# Patient Record
Sex: Male | Born: 1995 | Race: White | Hispanic: No | Marital: Single | Smoking: Never smoker
Health system: Southern US, Community
[De-identification: ages and names within clinical notes are randomized; demographics above are authoritative.]

## PROBLEM LIST (undated history)

## (undated) DIAGNOSIS — G43909 Migraine, unspecified, not intractable, without status migrainosus: Secondary | ICD-10-CM

## (undated) HISTORY — DX: Migraine, unspecified, not intractable, without status migrainosus: G43.909

## (undated) HISTORY — PX: ANTERIOR CRUCIATE LIGAMENT REPAIR: SHX115

---
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
## 2016-02-05 DIAGNOSIS — M542 Cervicalgia: Secondary | ICD-10-CM | POA: Diagnosis not present

## 2016-03-17 DIAGNOSIS — R51 Headache: Secondary | ICD-10-CM | POA: Diagnosis not present

## 2016-03-17 DIAGNOSIS — F329 Major depressive disorder, single episode, unspecified: Secondary | ICD-10-CM | POA: Diagnosis not present

## 2016-03-17 DIAGNOSIS — R42 Dizziness and giddiness: Secondary | ICD-10-CM | POA: Diagnosis not present

## 2016-03-17 DIAGNOSIS — F419 Anxiety disorder, unspecified: Secondary | ICD-10-CM | POA: Diagnosis not present

## 2016-03-17 DIAGNOSIS — R55 Syncope and collapse: Secondary | ICD-10-CM | POA: Diagnosis not present

## 2016-03-17 DIAGNOSIS — Z79899 Other long term (current) drug therapy: Secondary | ICD-10-CM | POA: Diagnosis not present

## 2016-03-17 DIAGNOSIS — F429 Obsessive-compulsive disorder, unspecified: Secondary | ICD-10-CM | POA: Diagnosis not present

## 2016-03-17 DIAGNOSIS — I4519 Other right bundle-branch block: Secondary | ICD-10-CM | POA: Diagnosis not present

## 2016-08-14 DIAGNOSIS — G4710 Hypersomnia, unspecified: Secondary | ICD-10-CM | POA: Diagnosis not present

## 2016-10-02 DIAGNOSIS — L237 Allergic contact dermatitis due to plants, except food: Secondary | ICD-10-CM | POA: Diagnosis not present

## 2016-11-20 DIAGNOSIS — G4710 Hypersomnia, unspecified: Secondary | ICD-10-CM | POA: Diagnosis not present

## 2016-12-02 DIAGNOSIS — G4710 Hypersomnia, unspecified: Secondary | ICD-10-CM | POA: Diagnosis not present

## 2017-04-30 DIAGNOSIS — M542 Cervicalgia: Secondary | ICD-10-CM | POA: Diagnosis not present

## 2017-04-30 DIAGNOSIS — Z6820 Body mass index (BMI) 20.0-20.9, adult: Secondary | ICD-10-CM | POA: Diagnosis not present

## 2017-04-30 DIAGNOSIS — J019 Acute sinusitis, unspecified: Secondary | ICD-10-CM | POA: Diagnosis not present

## 2017-10-14 DIAGNOSIS — F411 Generalized anxiety disorder: Secondary | ICD-10-CM | POA: Diagnosis not present

## 2017-10-28 DIAGNOSIS — Z0000 Encounter for general adult medical examination without abnormal findings: Secondary | ICD-10-CM | POA: Diagnosis not present

## 2017-11-04 DIAGNOSIS — Z681 Body mass index (BMI) 19 or less, adult: Secondary | ICD-10-CM | POA: Diagnosis not present

## 2017-11-04 DIAGNOSIS — I498 Other specified cardiac arrhythmias: Secondary | ICD-10-CM | POA: Diagnosis not present

## 2017-11-04 DIAGNOSIS — R002 Palpitations: Secondary | ICD-10-CM | POA: Diagnosis not present

## 2017-11-04 DIAGNOSIS — J309 Allergic rhinitis, unspecified: Secondary | ICD-10-CM | POA: Diagnosis not present

## 2017-11-04 DIAGNOSIS — Z0001 Encounter for general adult medical examination with abnormal findings: Secondary | ICD-10-CM | POA: Diagnosis not present

## 2017-11-26 DIAGNOSIS — F411 Generalized anxiety disorder: Secondary | ICD-10-CM | POA: Diagnosis not present

## 2017-12-25 DIAGNOSIS — F411 Generalized anxiety disorder: Secondary | ICD-10-CM | POA: Diagnosis not present

## 2018-01-27 DIAGNOSIS — R079 Chest pain, unspecified: Secondary | ICD-10-CM | POA: Diagnosis not present

## 2018-01-27 DIAGNOSIS — M542 Cervicalgia: Secondary | ICD-10-CM | POA: Diagnosis not present

## 2018-01-27 DIAGNOSIS — R51 Headache: Secondary | ICD-10-CM | POA: Diagnosis not present

## 2018-01-27 DIAGNOSIS — R002 Palpitations: Secondary | ICD-10-CM | POA: Diagnosis not present

## 2018-02-24 DIAGNOSIS — K219 Gastro-esophageal reflux disease without esophagitis: Secondary | ICD-10-CM | POA: Diagnosis not present

## 2018-02-24 DIAGNOSIS — F419 Anxiety disorder, unspecified: Secondary | ICD-10-CM | POA: Diagnosis not present

## 2018-02-24 DIAGNOSIS — J060 Acute laryngopharyngitis: Secondary | ICD-10-CM | POA: Diagnosis not present

## 2018-05-05 DIAGNOSIS — F3340 Major depressive disorder, recurrent, in remission, unspecified: Secondary | ICD-10-CM | POA: Diagnosis not present

## 2018-05-05 DIAGNOSIS — F411 Generalized anxiety disorder: Secondary | ICD-10-CM | POA: Diagnosis not present

## 2018-05-05 DIAGNOSIS — F0634 Mood disorder due to known physiological condition with mixed features: Secondary | ICD-10-CM | POA: Diagnosis not present

## 2018-06-07 DIAGNOSIS — F411 Generalized anxiety disorder: Secondary | ICD-10-CM | POA: Diagnosis not present

## 2018-06-07 DIAGNOSIS — F0634 Mood disorder due to known physiological condition with mixed features: Secondary | ICD-10-CM | POA: Diagnosis not present

## 2018-07-19 DIAGNOSIS — F411 Generalized anxiety disorder: Secondary | ICD-10-CM | POA: Diagnosis not present

## 2018-07-19 DIAGNOSIS — F0634 Mood disorder due to known physiological condition with mixed features: Secondary | ICD-10-CM | POA: Diagnosis not present

## 2018-10-28 DIAGNOSIS — H6123 Impacted cerumen, bilateral: Secondary | ICD-10-CM | POA: Diagnosis not present

## 2018-11-02 DIAGNOSIS — Z23 Encounter for immunization: Secondary | ICD-10-CM | POA: Diagnosis not present

## 2018-12-13 DIAGNOSIS — F411 Generalized anxiety disorder: Secondary | ICD-10-CM | POA: Diagnosis not present

## 2018-12-13 DIAGNOSIS — F0634 Mood disorder due to known physiological condition with mixed features: Secondary | ICD-10-CM | POA: Diagnosis not present

## 2019-01-17 DIAGNOSIS — F0633 Mood disorder due to known physiological condition with manic features: Secondary | ICD-10-CM | POA: Diagnosis not present

## 2019-01-17 DIAGNOSIS — F411 Generalized anxiety disorder: Secondary | ICD-10-CM | POA: Diagnosis not present

## 2019-04-14 DIAGNOSIS — F411 Generalized anxiety disorder: Secondary | ICD-10-CM | POA: Diagnosis not present

## 2019-05-26 DIAGNOSIS — F411 Generalized anxiety disorder: Secondary | ICD-10-CM | POA: Diagnosis not present

## 2019-05-26 DIAGNOSIS — F0633 Mood disorder due to known physiological condition with manic features: Secondary | ICD-10-CM | POA: Diagnosis not present

## 2020-01-11 DIAGNOSIS — K219 Gastro-esophageal reflux disease without esophagitis: Secondary | ICD-10-CM | POA: Diagnosis not present

## 2020-02-02 DIAGNOSIS — F411 Generalized anxiety disorder: Secondary | ICD-10-CM | POA: Diagnosis not present

## 2020-02-02 DIAGNOSIS — F0633 Mood disorder due to known physiological condition with manic features: Secondary | ICD-10-CM | POA: Diagnosis not present

## 2020-03-02 DIAGNOSIS — F411 Generalized anxiety disorder: Secondary | ICD-10-CM | POA: Diagnosis not present

## 2020-03-02 DIAGNOSIS — F0633 Mood disorder due to known physiological condition with manic features: Secondary | ICD-10-CM | POA: Diagnosis not present

## 2020-03-21 DIAGNOSIS — S8992XA Unspecified injury of left lower leg, initial encounter: Secondary | ICD-10-CM | POA: Diagnosis not present

## 2020-03-21 DIAGNOSIS — S83512A Sprain of anterior cruciate ligament of left knee, initial encounter: Secondary | ICD-10-CM | POA: Diagnosis not present

## 2020-03-21 DIAGNOSIS — S83519A Sprain of anterior cruciate ligament of unspecified knee, initial encounter: Secondary | ICD-10-CM | POA: Diagnosis not present

## 2020-03-21 DIAGNOSIS — Y9323 Activity, snow (alpine) (downhill) skiing, snow boarding, sledding, tobogganing and snow tubing: Secondary | ICD-10-CM | POA: Diagnosis not present

## 2020-03-29 DIAGNOSIS — J0100 Acute maxillary sinusitis, unspecified: Secondary | ICD-10-CM | POA: Diagnosis not present

## 2020-03-29 DIAGNOSIS — M25562 Pain in left knee: Secondary | ICD-10-CM | POA: Diagnosis not present

## 2020-04-10 DIAGNOSIS — S83512A Sprain of anterior cruciate ligament of left knee, initial encounter: Secondary | ICD-10-CM | POA: Diagnosis not present

## 2020-04-11 DIAGNOSIS — M25562 Pain in left knee: Secondary | ICD-10-CM | POA: Diagnosis not present

## 2020-04-13 DIAGNOSIS — S83512A Sprain of anterior cruciate ligament of left knee, initial encounter: Secondary | ICD-10-CM | POA: Diagnosis not present

## 2020-05-07 DIAGNOSIS — S83512A Sprain of anterior cruciate ligament of left knee, initial encounter: Secondary | ICD-10-CM | POA: Diagnosis not present

## 2020-05-07 DIAGNOSIS — S83282A Other tear of lateral meniscus, current injury, left knee, initial encounter: Secondary | ICD-10-CM | POA: Diagnosis not present

## 2020-05-07 DIAGNOSIS — X58XXXA Exposure to other specified factors, initial encounter: Secondary | ICD-10-CM | POA: Diagnosis not present

## 2020-05-07 DIAGNOSIS — Y999 Unspecified external cause status: Secondary | ICD-10-CM | POA: Diagnosis not present

## 2020-05-07 DIAGNOSIS — G8918 Other acute postprocedural pain: Secondary | ICD-10-CM | POA: Diagnosis not present

## 2020-05-15 DIAGNOSIS — S83512D Sprain of anterior cruciate ligament of left knee, subsequent encounter: Secondary | ICD-10-CM | POA: Diagnosis not present

## 2020-05-31 DIAGNOSIS — S83512D Sprain of anterior cruciate ligament of left knee, subsequent encounter: Secondary | ICD-10-CM | POA: Diagnosis not present

## 2020-06-11 DIAGNOSIS — M25562 Pain in left knee: Secondary | ICD-10-CM | POA: Diagnosis not present

## 2020-06-14 DIAGNOSIS — M25562 Pain in left knee: Secondary | ICD-10-CM | POA: Diagnosis not present

## 2020-06-15 DIAGNOSIS — Z0001 Encounter for general adult medical examination with abnormal findings: Secondary | ICD-10-CM | POA: Diagnosis not present

## 2020-06-19 DIAGNOSIS — M25562 Pain in left knee: Secondary | ICD-10-CM | POA: Diagnosis not present

## 2020-06-21 DIAGNOSIS — M25562 Pain in left knee: Secondary | ICD-10-CM | POA: Diagnosis not present

## 2020-07-03 DIAGNOSIS — E559 Vitamin D deficiency, unspecified: Secondary | ICD-10-CM | POA: Diagnosis not present

## 2020-07-03 DIAGNOSIS — R509 Fever, unspecified: Secondary | ICD-10-CM | POA: Diagnosis not present

## 2020-07-03 DIAGNOSIS — K219 Gastro-esophageal reflux disease without esophagitis: Secondary | ICD-10-CM | POA: Diagnosis not present

## 2020-07-03 DIAGNOSIS — R300 Dysuria: Secondary | ICD-10-CM | POA: Diagnosis not present

## 2020-07-03 DIAGNOSIS — F419 Anxiety disorder, unspecified: Secondary | ICD-10-CM | POA: Diagnosis not present

## 2020-07-03 DIAGNOSIS — J060 Acute laryngopharyngitis: Secondary | ICD-10-CM | POA: Diagnosis not present

## 2020-07-03 DIAGNOSIS — J029 Acute pharyngitis, unspecified: Secondary | ICD-10-CM | POA: Diagnosis not present

## 2020-07-03 DIAGNOSIS — R0981 Nasal congestion: Secondary | ICD-10-CM | POA: Diagnosis not present

## 2020-07-05 DIAGNOSIS — R5382 Chronic fatigue, unspecified: Secondary | ICD-10-CM | POA: Diagnosis not present

## 2020-07-05 DIAGNOSIS — F419 Anxiety disorder, unspecified: Secondary | ICD-10-CM | POA: Diagnosis not present

## 2020-07-05 DIAGNOSIS — F900 Attention-deficit hyperactivity disorder, predominantly inattentive type: Secondary | ICD-10-CM | POA: Diagnosis not present

## 2020-07-05 DIAGNOSIS — G4700 Insomnia, unspecified: Secondary | ICD-10-CM | POA: Diagnosis not present

## 2020-07-19 DIAGNOSIS — F900 Attention-deficit hyperactivity disorder, predominantly inattentive type: Secondary | ICD-10-CM | POA: Diagnosis not present

## 2020-09-04 DIAGNOSIS — S83512D Sprain of anterior cruciate ligament of left knee, subsequent encounter: Secondary | ICD-10-CM | POA: Diagnosis not present

## 2020-12-04 DIAGNOSIS — S83512D Sprain of anterior cruciate ligament of left knee, subsequent encounter: Secondary | ICD-10-CM | POA: Diagnosis not present

## 2020-12-18 DIAGNOSIS — S338XXA Sprain of other parts of lumbar spine and pelvis, initial encounter: Secondary | ICD-10-CM | POA: Diagnosis not present

## 2020-12-18 DIAGNOSIS — S134XXA Sprain of ligaments of cervical spine, initial encounter: Secondary | ICD-10-CM | POA: Diagnosis not present

## 2020-12-18 DIAGNOSIS — M546 Pain in thoracic spine: Secondary | ICD-10-CM | POA: Diagnosis not present

## 2020-12-20 DIAGNOSIS — F5104 Psychophysiologic insomnia: Secondary | ICD-10-CM | POA: Diagnosis not present

## 2020-12-20 DIAGNOSIS — F909 Attention-deficit hyperactivity disorder, unspecified type: Secondary | ICD-10-CM | POA: Diagnosis not present

## 2020-12-20 DIAGNOSIS — F319 Bipolar disorder, unspecified: Secondary | ICD-10-CM | POA: Diagnosis not present

## 2020-12-21 DIAGNOSIS — M6281 Muscle weakness (generalized): Secondary | ICD-10-CM | POA: Diagnosis not present

## 2020-12-21 DIAGNOSIS — M25562 Pain in left knee: Secondary | ICD-10-CM | POA: Diagnosis not present

## 2020-12-24 DIAGNOSIS — M25562 Pain in left knee: Secondary | ICD-10-CM | POA: Diagnosis not present

## 2020-12-24 DIAGNOSIS — M6281 Muscle weakness (generalized): Secondary | ICD-10-CM | POA: Diagnosis not present

## 2020-12-26 DIAGNOSIS — M25562 Pain in left knee: Secondary | ICD-10-CM | POA: Diagnosis not present

## 2020-12-26 DIAGNOSIS — S134XXA Sprain of ligaments of cervical spine, initial encounter: Secondary | ICD-10-CM | POA: Diagnosis not present

## 2020-12-26 DIAGNOSIS — S338XXA Sprain of other parts of lumbar spine and pelvis, initial encounter: Secondary | ICD-10-CM | POA: Diagnosis not present

## 2020-12-26 DIAGNOSIS — M546 Pain in thoracic spine: Secondary | ICD-10-CM | POA: Diagnosis not present

## 2020-12-26 DIAGNOSIS — M6281 Muscle weakness (generalized): Secondary | ICD-10-CM | POA: Diagnosis not present

## 2020-12-31 DIAGNOSIS — M6281 Muscle weakness (generalized): Secondary | ICD-10-CM | POA: Diagnosis not present

## 2020-12-31 DIAGNOSIS — M25562 Pain in left knee: Secondary | ICD-10-CM | POA: Diagnosis not present

## 2021-01-02 DIAGNOSIS — S134XXA Sprain of ligaments of cervical spine, initial encounter: Secondary | ICD-10-CM | POA: Diagnosis not present

## 2021-01-02 DIAGNOSIS — M6281 Muscle weakness (generalized): Secondary | ICD-10-CM | POA: Diagnosis not present

## 2021-01-02 DIAGNOSIS — S338XXA Sprain of other parts of lumbar spine and pelvis, initial encounter: Secondary | ICD-10-CM | POA: Diagnosis not present

## 2021-01-02 DIAGNOSIS — M25562 Pain in left knee: Secondary | ICD-10-CM | POA: Diagnosis not present

## 2021-01-02 DIAGNOSIS — M546 Pain in thoracic spine: Secondary | ICD-10-CM | POA: Diagnosis not present

## 2021-01-09 DIAGNOSIS — S338XXA Sprain of other parts of lumbar spine and pelvis, initial encounter: Secondary | ICD-10-CM | POA: Diagnosis not present

## 2021-01-09 DIAGNOSIS — S134XXA Sprain of ligaments of cervical spine, initial encounter: Secondary | ICD-10-CM | POA: Diagnosis not present

## 2021-01-09 DIAGNOSIS — M25562 Pain in left knee: Secondary | ICD-10-CM | POA: Diagnosis not present

## 2021-01-09 DIAGNOSIS — M546 Pain in thoracic spine: Secondary | ICD-10-CM | POA: Diagnosis not present

## 2021-01-09 DIAGNOSIS — M6281 Muscle weakness (generalized): Secondary | ICD-10-CM | POA: Diagnosis not present

## 2021-01-14 DIAGNOSIS — M6281 Muscle weakness (generalized): Secondary | ICD-10-CM | POA: Diagnosis not present

## 2021-01-14 DIAGNOSIS — M25562 Pain in left knee: Secondary | ICD-10-CM | POA: Diagnosis not present

## 2021-01-15 DIAGNOSIS — M25562 Pain in left knee: Secondary | ICD-10-CM | POA: Diagnosis not present

## 2021-01-16 DIAGNOSIS — M546 Pain in thoracic spine: Secondary | ICD-10-CM | POA: Diagnosis not present

## 2021-01-16 DIAGNOSIS — S338XXA Sprain of other parts of lumbar spine and pelvis, initial encounter: Secondary | ICD-10-CM | POA: Diagnosis not present

## 2021-01-16 DIAGNOSIS — S134XXA Sprain of ligaments of cervical spine, initial encounter: Secondary | ICD-10-CM | POA: Diagnosis not present

## 2021-01-18 DIAGNOSIS — M6281 Muscle weakness (generalized): Secondary | ICD-10-CM | POA: Diagnosis not present

## 2021-01-18 DIAGNOSIS — M25562 Pain in left knee: Secondary | ICD-10-CM | POA: Diagnosis not present

## 2021-01-21 DIAGNOSIS — Z760 Encounter for issue of repeat prescription: Secondary | ICD-10-CM | POA: Diagnosis not present

## 2021-01-21 DIAGNOSIS — F801 Expressive language disorder: Secondary | ICD-10-CM | POA: Diagnosis not present

## 2021-01-21 DIAGNOSIS — F319 Bipolar disorder, unspecified: Secondary | ICD-10-CM | POA: Diagnosis not present

## 2021-01-21 DIAGNOSIS — F909 Attention-deficit hyperactivity disorder, unspecified type: Secondary | ICD-10-CM | POA: Diagnosis not present

## 2021-01-30 DIAGNOSIS — S134XXA Sprain of ligaments of cervical spine, initial encounter: Secondary | ICD-10-CM | POA: Diagnosis not present

## 2021-01-30 DIAGNOSIS — S338XXA Sprain of other parts of lumbar spine and pelvis, initial encounter: Secondary | ICD-10-CM | POA: Diagnosis not present

## 2021-01-30 DIAGNOSIS — M546 Pain in thoracic spine: Secondary | ICD-10-CM | POA: Diagnosis not present

## 2021-02-01 DIAGNOSIS — M25562 Pain in left knee: Secondary | ICD-10-CM | POA: Diagnosis not present

## 2021-02-01 DIAGNOSIS — S83512D Sprain of anterior cruciate ligament of left knee, subsequent encounter: Secondary | ICD-10-CM | POA: Diagnosis not present

## 2021-02-01 DIAGNOSIS — M6281 Muscle weakness (generalized): Secondary | ICD-10-CM | POA: Diagnosis not present

## 2021-02-06 DIAGNOSIS — M546 Pain in thoracic spine: Secondary | ICD-10-CM | POA: Diagnosis not present

## 2021-02-06 DIAGNOSIS — S134XXA Sprain of ligaments of cervical spine, initial encounter: Secondary | ICD-10-CM | POA: Diagnosis not present

## 2021-02-06 DIAGNOSIS — S338XXA Sprain of other parts of lumbar spine and pelvis, initial encounter: Secondary | ICD-10-CM | POA: Diagnosis not present

## 2021-02-13 DIAGNOSIS — M546 Pain in thoracic spine: Secondary | ICD-10-CM | POA: Diagnosis not present

## 2021-02-13 DIAGNOSIS — S134XXA Sprain of ligaments of cervical spine, initial encounter: Secondary | ICD-10-CM | POA: Diagnosis not present

## 2021-02-13 DIAGNOSIS — S338XXA Sprain of other parts of lumbar spine and pelvis, initial encounter: Secondary | ICD-10-CM | POA: Diagnosis not present

## 2021-02-26 DIAGNOSIS — J029 Acute pharyngitis, unspecified: Secondary | ICD-10-CM | POA: Diagnosis not present

## 2021-02-28 DIAGNOSIS — M546 Pain in thoracic spine: Secondary | ICD-10-CM | POA: Diagnosis not present

## 2021-02-28 DIAGNOSIS — S338XXA Sprain of other parts of lumbar spine and pelvis, initial encounter: Secondary | ICD-10-CM | POA: Diagnosis not present

## 2021-02-28 DIAGNOSIS — S134XXA Sprain of ligaments of cervical spine, initial encounter: Secondary | ICD-10-CM | POA: Diagnosis not present

## 2021-03-04 ENCOUNTER — Ambulatory Visit (HOSPITAL_COMMUNITY): Payer: BC Managed Care – PPO | Attending: Family Medicine | Admitting: Speech Pathology

## 2021-03-04 DIAGNOSIS — R4789 Other speech disturbances: Secondary | ICD-10-CM | POA: Insufficient documentation

## 2021-03-11 ENCOUNTER — Other Ambulatory Visit: Payer: Self-pay

## 2021-03-11 ENCOUNTER — Ambulatory Visit (HOSPITAL_COMMUNITY): Payer: BC Managed Care – PPO | Admitting: Speech Pathology

## 2021-03-11 ENCOUNTER — Encounter (HOSPITAL_COMMUNITY): Payer: Self-pay | Admitting: Speech Pathology

## 2021-03-11 DIAGNOSIS — R4789 Other speech disturbances: Secondary | ICD-10-CM | POA: Diagnosis not present

## 2021-03-11 DIAGNOSIS — M546 Pain in thoracic spine: Secondary | ICD-10-CM | POA: Diagnosis not present

## 2021-03-11 DIAGNOSIS — S338XXA Sprain of other parts of lumbar spine and pelvis, initial encounter: Secondary | ICD-10-CM | POA: Diagnosis not present

## 2021-03-11 DIAGNOSIS — S134XXA Sprain of ligaments of cervical spine, initial encounter: Secondary | ICD-10-CM | POA: Diagnosis not present

## 2021-03-11 NOTE — Therapy (Signed)
[facility] [address] [place], [place], [address] Phone: [phone]   Fax:  [phone]  Speech Language Pathology Evaluation  Patient Details  Name: Evan Olsen MRN: [id number] Date of Birth: 04/26/1995 Referring Provider (SLP): Leone Payor, FNP   Encounter Date: 03/11/2021   End of Session - 03/11/21 1641     Visit Number 1    Number of Visits 4    Date for SLP Re-Evaluation 04/11/21    Authorization Type BCBS Comm PPO    SLP Start Time [phone]    SLP Stop Time  1000    SLP Time Calculation (min) 56 min    Activity Tolerance Patient tolerated treatment well             History reviewed. No pertinent past medical history.  History reviewed. No pertinent surgical history.  There were no vitals filed for this visit.   Subjective Assessment - 03/11/21 0933     Subjective "People can't understand me."    Currently in Pain? No/denies                SLP Evaluation [facility] - 03/11/21 1633       SLP Visit Information   SLP Received On 03/11/21    Referring Provider (SLP) Leone Payor, FNP    Onset Date 01/21/2021    Medical Diagnosis expressive language disorder per MD      Subjective   Subjective "People ask me to slow down."    Patient/Family Stated Goal Improve speech      Pain Assessment   Currently in Pain? No/denies      General Information   HPI Evan Olsen is a 25 yo male who was referred by Leone Payor, FNP for a speech/language evaluation and treatment due to Pt reports of reduced speech intelligibility and hesistations in his speech. He reports that he has always been a "fast talker" and that when he slows down, his speech improves. Pt had braces for 9 years.    Behavioral/Cognition alert and cooperative    Mobility Status ambulatory      Balance Screen   Has the patient fallen in the past 6 months No    Has the patient had a decrease in activity level because of a fear of falling?  No     Is the patient reluctant to leave their home because of a fear of falling?  No      Prior Functional Status   Cognitive/Linguistic Baseline Within functional limits    Type of Home House     Lives With Family    Available Support Family    Education college    Vocation Student      Cognition   Overall Cognitive Status Within Functional Limits for tasks assessed    Memory Appears intact    Awareness Appears intact    Problem Solving Appears intact      Auditory Comprehension   Overall Auditory Comprehension Appears within functional limits for tasks assessed      Visual Recognition/Discrimination   Discrimination Within Function Limits      Reading Comprehension   Reading Status Within funtional limits      Expression   Primary Mode of Expression Verbal      Verbal Expression   Overall Verbal Expression Appears within functional limits for tasks assessed    Initiation No impairment    Level of Generative/Spontaneous Verbalization Conversation    Repetition No impairment    Naming  No impairment    Pragmatics No impairment    Interfering Components Speech intelligibility    Non-Verbal Means of Communication Not applicable      Written Expression   Written Expression Not tested      Oral Motor/Sensory Function   Overall Oral Motor/Sensory Function Appears within functional limits for tasks assessed    Velum Within Functional Limits    Mandible Within Functional Limits      Motor Speech   Overall Motor Speech Impaired    Respiration Within functional limits    Phonation Normal    Resonance Within functional limits    Articulation Impaired    Level of Impairment Conversation    Intelligibility Intelligibility reduced    Word 75-100% accurate    Phrase 75-100% accurate    Sentence 75-100% accurate    Conversation 75-100% accurate    Motor Planning Witnin functional limits    Motor Speech Errors Aware;Inconsistent    Effective Techniques Slow  rate;Over-articulate;Pause    Phonation [facility]               SLP Education - 03/11/21 1640     Education Details Plan for short term SLP therapy to address speech deficits    Person(s) Educated Patient    Methods Explanation;Handout    Comprehension Verbalized understanding              03/11/21 1643  SLP SHORT TERM GOAL #1  Title Pt will utilize speech intelligibility strategies (over articulation, reduced speaking rate, and vocal intensity) at the conversation level with 95% acc and cues for error awareness as needed.  Baseline 90%  Time 4  Period Weeks  Status New  Target Date 04/11/21  SLP SHORT TERM GOAL #2  Title Pt will implement fluency enhancing and speech intelligibility strategies during oral reading of short paragraphs with 95% effectiveness and cues for error awareness.  Baseline 90%  Time 4  Period Weeks  Status New  Target Date 04/11/21      Plan - 03/11/21 1642     Clinical Impression Statement Pt presents with mild reduced speech intelligibility and dysfluency of speech characterized by occasional hesitations, rapid speaking rate, and reduced movement of articulators. Cognitive and language skills are WNL. Evan Olsen states that he has taken a few public speaking courses and "did well" in the class. He is frustrated when others cannot understand him, but says when he slows down, his speech is improved. He is studying winter weather systems and would like to possibly go into broadcasting at some point. He reports that his speech difficulties happen in small groups, larger groups, and on the telephone. Pt was recorded in the evaluation and no sound repetitions were heard, only occasional hesitations, and mild reduced speech intelligibility during oral reading task. Pt was cued to decrease rate and over articulate during second oral reading task and resulted in notable improved speech intelligibility. Recommend 2-3 SLP sessions to teach compensatory strategies of  reducing rate, over articulating, and purposeful pauses for fluency and speech intelligibility. Pt in agreement with plan of care.    Speech Therapy Frequency 1x /week    Duration 4 weeks    Treatment/Interventions Compensatory strategies;Patient/family education;Cueing hierarchy;Compensatory techniques;Internal/external aids;SLP instruction and feedback    Potential to Achieve Goals Good    SLP Home Exercise Plan Pt will completed HEP as assigned to facilitate carryover of treatment strategies and techniques in home environment with use of written cues as needed.    Consulted and Agree with  Plan of Care Patient             Patient will benefit from skilled therapeutic intervention in order to improve the following deficits and impairments:   Dysfluency    Problem List There are no problems to display for this patient.  Thank you,  Evan Olsen, CCC-SLP [phone]  Evan Olsen 03/11/2021, 4:45 PM  [facility] [address] [place], [place], [address] Phone: [phone]   Fax:  [phone]  Name: RALPHEAL ZAPPONE MRN: [id number] Date of Birth: 05-24-1995

## 2021-03-19 DIAGNOSIS — Z111 Encounter for screening for respiratory tuberculosis: Secondary | ICD-10-CM | POA: Diagnosis not present

## 2021-03-20 ENCOUNTER — Ambulatory Visit (HOSPITAL_COMMUNITY): Payer: BC Managed Care – PPO | Admitting: Speech Pathology

## 2021-03-20 ENCOUNTER — Encounter (HOSPITAL_COMMUNITY): Payer: Self-pay | Admitting: Speech Pathology

## 2021-03-20 ENCOUNTER — Other Ambulatory Visit: Payer: Self-pay

## 2021-03-20 DIAGNOSIS — R4789 Other speech disturbances: Secondary | ICD-10-CM

## 2021-03-20 NOTE — Therapy (Signed)
[place], [place], [address] Phone: [phone]   Fax:  [phone]  Speech Language Pathology Treatment  Patient Details  Name: Evan Olsen MRN: [id number] Date of Birth: 1995/09/20 Referring Provider (SLP): Valentino Nose, FNP   Encounter Date: 03/20/2021   End of Session - 03/20/21 1435     Visit Number 2    Number of Visits 4    Date for SLP Re-Evaluation 04/11/21    Authorization Type BCBS Comm PPO   BCBS COMM PPO 1/1-12/31/2022  Deduct 750- met  Covered at 100%  OOP 3500-met  visit limit 90- 0 used NO AUTH REQURIED   SLP Start Time 0915    SLP Stop Time  1000    SLP Time Calculation (min) 45 min    Activity Tolerance Patient tolerated treatment well             History reviewed. No pertinent past medical history.  History reviewed. No pertinent surgical history.  There were no vitals filed for this visit.   Subjective Assessment - 03/20/21 1433     Subjective "I talk too fast when I know what I'm talking about."    Currently in Pain? No/denies               ADULT SLP TREATMENT - 03/20/21 1434       General Information   Behavior/Cognition Alert;Cooperative;Pleasant mood    Patient Positioning Upright in chair    Oral care provided N/A    HPI Evan Olsen is a 25 yo male who was referred by Valentino Nose, FNP for a speech/language evaluation and treatment due to Pt reports of reduced speech intelligibility and hesistations in his speech. He reports that he has always been a "fast talker" and that when he slows down, his speech improves. Pt had braces for 9 years.      Treatment Provided   Treatment provided Cognitive-Linquistic      Pain Assessment   Pain Assessment No/denies pain      Cognitive-Linquistic Treatment   Treatment focused on Other (comment);Patient/family/caregiver education   fluency   Skilled Treatment fluency enhancing strategies, self evaluation, HEP       Assessment / Recommendations / Plan   Plan Continue with current plan of care      Progression Toward Goals   Progression toward goals Progressing toward goals                SLP Short Term Goals - 03/20/21 1435       SLP SHORT TERM GOAL #1   Title Pt will utilize speech intelligibility strategies (over articulation, reduced speaking rate, and vocal intensity) at the conversation level with 95% acc and cues for error awareness as needed.    Baseline 90%    Time 4    Period Weeks    Status On-going    Target Date 04/11/21      SLP SHORT TERM GOAL #2   Title Pt will implement fluency enhancing and speech intelligibility strategies during oral reading of short paragraphs with 95% effectiveness and cues for error awareness.    Baseline 90%    Time 4    Period Weeks    Status On-going    Target Date 04/11/21      SLP SHORT TERM GOAL #3   Status On-going              SLP Long Term Goals - 03/11/21 1644  SLP LONG TERM GOAL #1   Title Same as short term goals              Plan - 03/20/21 1435     Clinical Impression Statement Evan Olsen reported that he tends to speak faster and stumble on his words when he "knows" what he's talking about. He became aware of this when having a conversation he was passionate about, with a small group of friends. He needed two reminders to decrease rate during conversation in session and resulted in notable improved speech intelligibility. He was reminded to record himself during oral reading tasks at home with focus on compensatory strategies of reducing rate, over articulating, and purposeful pauses for fluency and speech intelligibility.    Speech Therapy Frequency 1x /week    Duration 4 weeks    Treatment/Interventions Compensatory strategies;Patient/family education;Cueing hierarchy;Compensatory techniques;Internal/external aids;SLP instruction and feedback    Potential to Achieve Goals Good    SLP Home Exercise Plan Pt will  completed HEP as assigned to facilitate carryover of treatment strategies and techniques in home environment with use of written cues as needed.    Consulted and Agree with Plan of Care Patient             Patient will benefit from skilled therapeutic intervention in order to improve the following deficits and impairments:   Dysfluency    Problem List There are no problems to display for this patient.  Thank you,  Evan Olsen, [place]  Evan Olsen 03/20/2021, 2:36 PM  [facility] [address] [place], [place], [address] Phone: [phone]   Fax:  [phone]   Name: Evan Olsen MRN: [id number] Date of Birth: [birth year]

## 2021-03-21 DIAGNOSIS — Z0000 Encounter for general adult medical examination without abnormal findings: Secondary | ICD-10-CM | POA: Diagnosis not present

## 2021-03-21 DIAGNOSIS — Z23 Encounter for immunization: Secondary | ICD-10-CM | POA: Diagnosis not present

## 2021-03-27 ENCOUNTER — Ambulatory Visit (HOSPITAL_COMMUNITY): Payer: BC Managed Care – PPO | Admitting: Speech Pathology

## 2021-03-28 DIAGNOSIS — Z0001 Encounter for general adult medical examination with abnormal findings: Secondary | ICD-10-CM | POA: Diagnosis not present

## 2021-04-01 DIAGNOSIS — M546 Pain in thoracic spine: Secondary | ICD-10-CM | POA: Diagnosis not present

## 2021-04-01 DIAGNOSIS — S338XXA Sprain of other parts of lumbar spine and pelvis, initial encounter: Secondary | ICD-10-CM | POA: Diagnosis not present

## 2021-04-01 DIAGNOSIS — S134XXA Sprain of ligaments of cervical spine, initial encounter: Secondary | ICD-10-CM | POA: Diagnosis not present

## 2021-04-03 ENCOUNTER — Ambulatory Visit (HOSPITAL_COMMUNITY): Payer: BC Managed Care – PPO | Attending: Family Medicine | Admitting: Speech Pathology

## 2021-04-03 ENCOUNTER — Encounter (HOSPITAL_COMMUNITY): Payer: Self-pay | Admitting: Speech Pathology

## 2021-04-03 ENCOUNTER — Other Ambulatory Visit: Payer: Self-pay

## 2021-04-03 DIAGNOSIS — R4789 Other speech disturbances: Secondary | ICD-10-CM | POA: Diagnosis not present

## 2021-04-03 NOTE — Therapy (Signed)
Evan Olsen, [place], [address] Phone: [phone]   Fax:  [phone]  Speech Language Pathology Treatment  Patient Details  Name: Evan Olsen MRN: [id number] Date of Birth: [birth year] Referring Provider (SLP): Valentino Nose, FNP   Encounter Date: 04/03/2021   End of Session - 04/03/21 1046     Visit Number 3    Number of Visits 4    Date for SLP Re-Evaluation 04/11/21    Authorization Type BCBS Comm PPO   BCBS COMM PPO 1/1-12/31/2022  Deduct 750- met  Covered at 100%  OOP 3500-met  visit limit 90- 0 used NO AUTH REQURIED   SLP Start Time 0910    SLP Stop Time  1020    SLP Time Calculation (min) 70 min    Activity Tolerance Patient tolerated treatment well             History reviewed. No pertinent past medical history.  History reviewed. No pertinent surgical history.  There were no vitals filed for this visit.   Subjective Assessment - 04/03/21 0942     Subjective "I have been trying to slow down."    Currently in Pain? No/denies               ADULT SLP TREATMENT - 04/03/21 0942       General Information   Behavior/Cognition Alert;Cooperative;Pleasant mood    Patient Positioning Upright in chair    Oral care provided N/A    HPI Evan Olsen is a 26 yo male who was referred by Valentino Nose, FNP for a speech/language evaluation and treatment due to Pt reports of reduced speech intelligibility and hesistations in his speech. He reports that he has always been a "fast talker" and that when he slows down, his speech improves. Pt had braces for 9 years.      Treatment Provided   Treatment provided Cognitive-Linquistic      Pain Assessment   Pain Assessment No/denies pain      Cognitive-Linquistic Treatment   Treatment focused on Other (comment);Patient/family/caregiver education   fluency   Skilled Treatment fluency enhancing strategies, self evaluation, HEP      Assessment /  Recommendations / Plan   Plan Continue with current plan of care      Progression Toward Goals   Progression toward goals Progressing toward goals                SLP Short Term Goals - 04/03/21 1133       SLP SHORT TERM GOAL #1   Title Pt will utilize speech intelligibility strategies (over articulation, reduced speaking rate, and vocal intensity) at the conversation level with 95% acc and cues for error awareness as needed.    Baseline 90%    Time 4    Period Weeks    Status On-going    Target Date 04/11/21      SLP SHORT TERM GOAL #2   Title Pt will implement fluency enhancing and speech intelligibility strategies during oral reading of short paragraphs with 95% effectiveness and cues for error awareness.    Baseline 90%    Time 4    Period Weeks    Status On-going    Target Date 04/11/21      SLP SHORT TERM GOAL #3   Status On-going              SLP Long Term Goals - 04/03/21 1133       SLP  LONG TERM GOAL #1   Title Same as short term goals              Plan - 04/03/21 1046     Clinical Impression Statement Evan Olsen indicated that his awareness is improving in conversations when he is speaking too quickly. He caught himself yesterday speaking quickly and told his friend he needed to take a break. He reports that he has been orally reading for practice, but not recording. In session, SLP requested clarification two times and he was able to self correct. Next session, he will present a 5 minute topic about weather and SLP will record and provide feedback. He was reminded to record himself during oral reading tasks at home with focus on compensatory strategies of reducing rate, over articulating, and purposeful pauses for fluency and speech intelligibility.    Speech Therapy Frequency 1x /week    Duration 4 weeks    Treatment/Interventions Compensatory strategies;Patient/family education;Cueing hierarchy;Compensatory techniques;Internal/external aids;SLP  instruction and feedback    Potential to Achieve Goals Good    SLP Home Exercise Plan Pt will completed HEP as assigned to facilitate carryover of treatment strategies and techniques in home environment with use of written cues as needed.    Consulted and Agree with Plan of Care Patient             Patient will benefit from skilled therapeutic intervention in order to improve the following deficits and impairments:   Dysfluency    Problem List There are no problems to display for this patient.  Thank you,  Genene Churn, [place]  Teddy Spike 04/03/2021, 11:36 AM  [facility] [address] [place], [place], [address] Phone: [phone]   Fax:  [phone]   Name: Evan Olsen MRN: [id number] Date of Birth: 1995-10-04

## 2021-04-10 ENCOUNTER — Ambulatory Visit (HOSPITAL_COMMUNITY): Payer: BC Managed Care – PPO | Admitting: Speech Pathology

## 2021-04-10 ENCOUNTER — Other Ambulatory Visit: Payer: Self-pay

## 2021-04-10 ENCOUNTER — Encounter (HOSPITAL_COMMUNITY): Payer: Self-pay | Admitting: Speech Pathology

## 2021-04-10 DIAGNOSIS — R4789 Other speech disturbances: Secondary | ICD-10-CM | POA: Diagnosis not present

## 2021-04-10 NOTE — Therapy (Signed)
[facility] [place], [place], [address] Phone: [phone]   Fax:  [phone]  Speech Language Pathology Treatment  Patient Details  Name: Evan Olsen MRN: [id number] Date of Birth: 1995-05-05 Referring Provider (SLP): Valentino Nose, FNP   Encounter Date: 04/10/2021   End of Session - 04/10/21 1010     Visit Number 4    Number of Visits 4    Date for SLP Re-Evaluation 04/11/21    Authorization Type BCBS Comm PPO   BCBS COMM PPO 1/1-12/31/2022  Deduct 750- met  Covered at 100%  OOP 3500-met  visit limit 90- 0 used NO AUTH REQURIED   SLP Start Time [phone]    SLP Stop Time  1035    SLP Time Calculation (min) 42 min    Activity Tolerance Patient tolerated treatment well             History reviewed. No pertinent past medical history.  History reviewed. No pertinent surgical history.  There were no vitals filed for this visit.   Subjective Assessment - 04/10/21 1001     Subjective "I applied for an internship."    Currently in Pain? No/denies               ADULT SLP TREATMENT - 04/10/21 1002       General Information   Behavior/Cognition Alert;Cooperative;Pleasant mood    Patient Positioning Upright in chair    Oral care provided N/A    HPI Evan Olsen is a 26 yo male who was referred by Valentino Nose, FNP for a speech/language evaluation and treatment due to Pt reports of reduced speech intelligibility and hesistations in his speech. He reports that he has always been a "fast talker" and that when he slows down, his speech improves. Pt had braces for 9 years.      Treatment Provided   Treatment provided Cognitive-Linquistic      Pain Assessment   Pain Assessment No/denies pain      Cognitive-Linquistic Treatment   Treatment focused on Other (comment);Patient/family/caregiver education    Skilled Treatment fluency enhancing strategies, self evaluation, HEP      Assessment / Recommendations /  Plan   Plan Continue with current plan of care      Progression Toward Goals   Progression toward goals Progressing toward goals                SLP Short Term Goals - 04/10/21 1020       SLP SHORT TERM GOAL #1   Title Pt will utilize speech intelligibility strategies (over articulation, reduced speaking rate, and vocal intensity) at the conversation level with 95% acc and cues for error awareness as needed.    Baseline 90%    Time 4    Period Weeks    Status Achieved    Target Date 04/11/21      SLP SHORT TERM GOAL #2   Title Pt will implement fluency enhancing and speech intelligibility strategies during oral reading of short paragraphs with 95% effectiveness and cues for error awareness.    Baseline 90%    Time 4    Period Weeks    Status Achieved    Target Date 04/11/21      SLP SHORT TERM GOAL #3   Status On-going              SLP Long Term Goals - 04/10/21 1021       SLP LONG TERM GOAL #1  Title Same as short term goals              Plan - 04/10/21 1018     Clinical Impression Statement Evan Olsen provided a 15 minute presentation on extra tropical cyclones and used power point to supplement his verbal presentation. Speech was 95% intelligible with SLP requesting clarification 3x for multisyllabic words over the 15 minute presentation. He continues to report increased error awareness when he is talking with his friends and begins to speak "too quickly". He was reminded to record himself during oral reading tasks at home with focus on compensatory strategies of reducing rate, over articulating, and purposeful pauses for fluency and speech intelligibility. SLP reiterated that he would benefit from this type of feedback so that his self awareness is targeted. He also might benefit from taking a public speaking class as he moves further into his career. No further SLP services indicated at this time.    Treatment/Interventions Compensatory  strategies;Patient/family education;Cueing hierarchy;Compensatory techniques;Internal/external aids;SLP instruction and feedback    Potential to Achieve Goals Good    SLP Home Exercise Plan Pt will completed HEP as assigned to facilitate carryover of treatment strategies and techniques in home environment with use of written cues as needed.    Consulted and Agree with Plan of Care Patient             Patient will benefit from skilled therapeutic intervention in order to improve the following deficits and impairments:   Dysfluency    Problem List There are no problems to display for this patient.  SPEECH THERAPY DISCHARGE SUMMARY  Visits from Start of Care: 4  Current functional level related to goals / functional outcomes: Goals met   Remaining deficits: Occasional rapid rate of speech   Education / Equipment: Complete   Patient agrees to discharge. Patient goals were met. Patient is being discharged due to being pleased with the current functional level..   Thank you,  Genene Churn, [place]  Teddy Spike 04/10/2021, 10:25 AM  [facility] [place], [place], [address] Phone: [phone]   Fax:  [phone]   Name: Evan Olsen MRN: [id number] Date of Birth: 06/26/1995

## 2021-04-13 DIAGNOSIS — B370 Candidal stomatitis: Secondary | ICD-10-CM | POA: Diagnosis not present

## 2021-04-13 DIAGNOSIS — J029 Acute pharyngitis, unspecified: Secondary | ICD-10-CM | POA: Diagnosis not present

## 2021-04-17 DIAGNOSIS — S338XXA Sprain of other parts of lumbar spine and pelvis, initial encounter: Secondary | ICD-10-CM | POA: Diagnosis not present

## 2021-04-17 DIAGNOSIS — M546 Pain in thoracic spine: Secondary | ICD-10-CM | POA: Diagnosis not present

## 2021-04-17 DIAGNOSIS — S134XXA Sprain of ligaments of cervical spine, initial encounter: Secondary | ICD-10-CM | POA: Diagnosis not present

## 2021-04-23 DIAGNOSIS — M5451 Vertebrogenic low back pain: Secondary | ICD-10-CM | POA: Diagnosis not present

## 2021-04-25 DIAGNOSIS — F319 Bipolar disorder, unspecified: Secondary | ICD-10-CM | POA: Diagnosis not present

## 2021-04-25 DIAGNOSIS — G43909 Migraine, unspecified, not intractable, without status migrainosus: Secondary | ICD-10-CM | POA: Diagnosis not present

## 2021-04-25 DIAGNOSIS — B370 Candidal stomatitis: Secondary | ICD-10-CM | POA: Diagnosis not present

## 2021-04-30 DIAGNOSIS — M5451 Vertebrogenic low back pain: Secondary | ICD-10-CM | POA: Diagnosis not present

## 2021-05-01 DIAGNOSIS — S338XXA Sprain of other parts of lumbar spine and pelvis, initial encounter: Secondary | ICD-10-CM | POA: Diagnosis not present

## 2021-05-01 DIAGNOSIS — S134XXA Sprain of ligaments of cervical spine, initial encounter: Secondary | ICD-10-CM | POA: Diagnosis not present

## 2021-05-01 DIAGNOSIS — M546 Pain in thoracic spine: Secondary | ICD-10-CM | POA: Diagnosis not present

## 2021-05-06 DIAGNOSIS — B370 Candidal stomatitis: Secondary | ICD-10-CM | POA: Diagnosis not present

## 2021-05-07 DIAGNOSIS — M5451 Vertebrogenic low back pain: Secondary | ICD-10-CM | POA: Diagnosis not present

## 2021-05-13 DIAGNOSIS — M5451 Vertebrogenic low back pain: Secondary | ICD-10-CM | POA: Diagnosis not present

## 2021-05-15 DIAGNOSIS — S338XXA Sprain of other parts of lumbar spine and pelvis, initial encounter: Secondary | ICD-10-CM | POA: Diagnosis not present

## 2021-05-15 DIAGNOSIS — M546 Pain in thoracic spine: Secondary | ICD-10-CM | POA: Diagnosis not present

## 2021-05-15 DIAGNOSIS — S134XXA Sprain of ligaments of cervical spine, initial encounter: Secondary | ICD-10-CM | POA: Diagnosis not present

## 2021-05-20 DIAGNOSIS — M5451 Vertebrogenic low back pain: Secondary | ICD-10-CM | POA: Diagnosis not present

## 2021-05-30 DIAGNOSIS — M5451 Vertebrogenic low back pain: Secondary | ICD-10-CM | POA: Diagnosis not present

## 2021-07-31 DIAGNOSIS — Z0000 Encounter for general adult medical examination without abnormal findings: Secondary | ICD-10-CM | POA: Diagnosis not present

## 2021-07-31 DIAGNOSIS — Z202 Contact with and (suspected) exposure to infections with a predominantly sexual mode of transmission: Secondary | ICD-10-CM | POA: Diagnosis not present

## 2021-08-09 DIAGNOSIS — F319 Bipolar disorder, unspecified: Secondary | ICD-10-CM | POA: Diagnosis not present

## 2021-08-09 DIAGNOSIS — Z113 Encounter for screening for infections with a predominantly sexual mode of transmission: Secondary | ICD-10-CM | POA: Diagnosis not present

## 2021-08-09 DIAGNOSIS — J309 Allergic rhinitis, unspecified: Secondary | ICD-10-CM | POA: Diagnosis not present

## 2021-08-09 DIAGNOSIS — F5104 Psychophysiologic insomnia: Secondary | ICD-10-CM | POA: Diagnosis not present

## 2021-08-23 DIAGNOSIS — Z0000 Encounter for general adult medical examination without abnormal findings: Secondary | ICD-10-CM | POA: Diagnosis not present

## 2021-08-23 DIAGNOSIS — Z202 Contact with and (suspected) exposure to infections with a predominantly sexual mode of transmission: Secondary | ICD-10-CM | POA: Diagnosis not present

## 2021-08-23 DIAGNOSIS — Z79899 Other long term (current) drug therapy: Secondary | ICD-10-CM | POA: Diagnosis not present

## 2021-12-09 DIAGNOSIS — Z113 Encounter for screening for infections with a predominantly sexual mode of transmission: Secondary | ICD-10-CM | POA: Diagnosis not present

## 2021-12-09 DIAGNOSIS — F419 Anxiety disorder, unspecified: Secondary | ICD-10-CM | POA: Diagnosis not present

## 2021-12-09 DIAGNOSIS — H9209 Otalgia, unspecified ear: Secondary | ICD-10-CM | POA: Diagnosis not present

## 2021-12-09 DIAGNOSIS — J029 Acute pharyngitis, unspecified: Secondary | ICD-10-CM | POA: Diagnosis not present

## 2021-12-23 DIAGNOSIS — K6289 Other specified diseases of anus and rectum: Secondary | ICD-10-CM | POA: Diagnosis not present

## 2021-12-23 DIAGNOSIS — J069 Acute upper respiratory infection, unspecified: Secondary | ICD-10-CM | POA: Diagnosis not present

## 2021-12-23 DIAGNOSIS — Z113 Encounter for screening for infections with a predominantly sexual mode of transmission: Secondary | ICD-10-CM | POA: Diagnosis not present

## 2021-12-23 DIAGNOSIS — B370 Candidal stomatitis: Secondary | ICD-10-CM | POA: Diagnosis not present

## 2022-03-03 ENCOUNTER — Other Ambulatory Visit: Payer: Self-pay

## 2022-03-03 ENCOUNTER — Encounter (HOSPITAL_COMMUNITY): Payer: Self-pay | Admitting: *Deleted

## 2022-03-03 ENCOUNTER — Emergency Department (HOSPITAL_COMMUNITY)
Admission: EM | Admit: 2022-03-03 | Discharge: 2022-03-03 | Disposition: A | Discharge status: Home / Self Care | Payer: BC Managed Care – PPO | Attending: Student | Admitting: Student

## 2022-03-03 ENCOUNTER — Other Ambulatory Visit (HOSPITAL_COMMUNITY): Payer: Self-pay | Admitting: Family Medicine

## 2022-03-03 ENCOUNTER — Emergency Department (HOSPITAL_COMMUNITY): Payer: BC Managed Care – PPO

## 2022-03-03 DIAGNOSIS — N433 Hydrocele, unspecified: Secondary | ICD-10-CM | POA: Diagnosis not present

## 2022-03-03 DIAGNOSIS — R1031 Right lower quadrant pain: Secondary | ICD-10-CM | POA: Diagnosis not present

## 2022-03-03 DIAGNOSIS — Z20822 Contact with and (suspected) exposure to covid-19: Secondary | ICD-10-CM | POA: Insufficient documentation

## 2022-03-03 DIAGNOSIS — R197 Diarrhea, unspecified: Secondary | ICD-10-CM | POA: Diagnosis not present

## 2022-03-03 DIAGNOSIS — R509 Fever, unspecified: Secondary | ICD-10-CM | POA: Diagnosis not present

## 2022-03-03 DIAGNOSIS — N50811 Right testicular pain: Secondary | ICD-10-CM | POA: Insufficient documentation

## 2022-03-03 DIAGNOSIS — R110 Nausea: Secondary | ICD-10-CM | POA: Insufficient documentation

## 2022-03-03 DIAGNOSIS — R109 Unspecified abdominal pain: Secondary | ICD-10-CM | POA: Diagnosis not present

## 2022-03-03 DIAGNOSIS — R300 Dysuria: Secondary | ICD-10-CM | POA: Diagnosis not present

## 2022-03-03 DIAGNOSIS — N50819 Testicular pain, unspecified: Secondary | ICD-10-CM | POA: Diagnosis not present

## 2022-03-03 DIAGNOSIS — N503 Cyst of epididymis: Secondary | ICD-10-CM | POA: Diagnosis not present

## 2022-03-03 LAB — CBC
HCT: 43 % (ref 39.0–52.0)
Hemoglobin: 13.9 g/dL (ref 13.0–17.0)
MCH: 28.3 pg (ref 26.0–34.0)
MCHC: 32.3 g/dL (ref 30.0–36.0)
MCV: 87.4 fL (ref 80.0–100.0)
Platelets: 206 10*3/uL (ref 150–400)
RBC: 4.92 MIL/uL (ref 4.22–5.81)
RDW: 12.3 % (ref 11.5–15.5)
WBC: 6.5 10*3/uL (ref 4.0–10.5)
nRBC: 0 % (ref 0.0–0.2)

## 2022-03-03 LAB — COMPREHENSIVE METABOLIC PANEL
ALT: 17 U/L (ref 0–44)
AST: 24 U/L (ref 15–41)
Albumin: 4.3 g/dL (ref 3.5–5.0)
Alkaline Phosphatase: 71 U/L (ref 38–126)
Anion gap: 9 (ref 5–15)
BUN: 10 mg/dL (ref 6–20)
CO2: 24 mmol/L (ref 22–32)
Calcium: 9.1 mg/dL (ref 8.9–10.3)
Chloride: 102 mmol/L (ref 98–111)
Creatinine, Ser: 1.16 mg/dL (ref 0.61–1.24)
GFR, Estimated: >60 mL/min (ref >60)
Glucose, Bld: 99 mg/dL (ref 70–99)
Potassium: 3.5 mmol/L (ref 3.5–5.1)
Sodium: 135 mmol/L (ref 135–145)
Total Bilirubin: 0.5 mg/dL (ref 0.3–1.2)
Total Protein: 7.9 g/dL (ref 6.5–8.1)

## 2022-03-03 LAB — URINALYSIS, ROUTINE W REFLEX MICROSCOPIC
Bilirubin Urine: NEGATIVE
Glucose, UA: NEGATIVE mg/dL
Hgb urine dipstick: NEGATIVE
Ketones, ur: NEGATIVE mg/dL
Leukocytes,Ua: NEGATIVE
Nitrite: NEGATIVE
Protein, ur: 30 mg/dL — AB
Specific Gravity, Urine: 1.021 (ref 1.005–1.030)
pH: 7 (ref 5.0–8.0)

## 2022-03-03 LAB — RESP PANEL BY RT-PCR (RSV, FLU A&B, COVID)  RVPGX2
Influenza A by PCR: NEGATIVE
Influenza B by PCR: NEGATIVE
Resp Syncytial Virus by PCR: NEGATIVE
SARS Coronavirus 2 by RT PCR: NEGATIVE

## 2022-03-03 LAB — GROUP A STREP BY PCR: Group A Strep by PCR: NOT DETECTED

## 2022-03-03 LAB — LIPASE, BLOOD: Lipase: 36 U/L (ref 11–51)

## 2022-03-03 MED ORDER — IOHEXOL 300 MG/ML  SOLN
80.0000 mL | Freq: Once | INTRAMUSCULAR | Status: AC | PRN
  Administered 2022-03-03: 80 mL via INTRAVENOUS

## 2022-03-03 MED ORDER — ACETAMINOPHEN 325 MG PO TABS
650.0000 mg | ORAL_TABLET | Freq: Once | ORAL | Status: AC
  Administered 2022-03-03: 650 mg via ORAL
  Filled 2022-03-03: qty 2

## 2022-03-03 MED ORDER — POLYETHYLENE GLYCOL 3350 17 G PO PACK: 17.0000 g | PACK | Freq: Every day | ORAL | 0 refills | Status: DC

## 2022-03-03 MED ORDER — DICYCLOMINE HCL 20 MG PO TABS: 20.0000 mg | ORAL_TABLET | Freq: Two times a day (BID) | ORAL | 0 refills | Status: DC

## 2022-03-03 NOTE — ED Provider Notes (Signed)
[facility] EMERGENCY DEPARTMENT Provider Note  CSN: [id number] Arrival date & time: 03/03/22 1443  Chief Complaint(s) Abdominal Pain  HPI Evan Olsen is a 26 y.o. male who presents emergency department for evaluation of right lower quadrant abdominal pain.  Patient states that he has had intermittent right lower quadrant abdominal pain over the last 1 month that will sometimes radiate into his testicle.  He states that over the last few days she has had worsening abdominal pain with associated diarrhea, nausea and was febrile to 102.0 at home.  Does endorse heavy lifting in the gym recently but has not seen any obvious hernia formation.  Currently well-appearing here in the emergency department endorsing very mild abdominal pain but he does arrive febrile.  Denies chest pain, shortness of breath, dysuria, increased frequency, penile discharge.   Past Medical History History reviewed. No pertinent past medical history. There are no problems to display for this patient.  Home Medication(s) Prior to Admission medications   Medication Sig Start Date End Date Taking? Authorizing Provider  dicyclomine (BENTYL) 20 MG tablet Take 1 tablet (20 mg total) by mouth 2 (two) times daily. 03/03/22  Yes Rosanna Bickle, MD  polyethylene glycol (MIRALAX) 17 g packet Take 17 g by mouth daily. 03/03/22  Yes Wray Goehring, MD                                                                                                                                    Past Surgical History Past Surgical History:  Procedure Laterality Date   ANTERIOR CRUCIATE LIGAMENT REPAIR Left    Family History History reviewed. No pertinent family history.  Social History Social History   Tobacco Use   Smoking status: Never   Smokeless tobacco: Never  Substance Use Topics   Alcohol use: Yes    Comment: occ use   Drug use: Never   Allergies Patient has no known allergies.  Review of Systems Review of Systems   Constitutional:  Positive for fever.  Gastrointestinal:  Positive for abdominal pain, diarrhea and nausea.  Genitourinary:  Positive for testicular pain.    Physical Exam Vital Signs  I have reviewed the triage vital signs BP 115/64   Pulse (!) 101   Temp 98.5 F (36.9 C) (Oral)   Resp 18   Ht 5\' 9"  (1.753 m)   Wt 59.9 kg   SpO2 100%   BMI 19.49 kg/m   Physical Exam Constitutional:      General: He is not in acute distress.    Appearance: Normal appearance.  HENT:     Head: Normocephalic and atraumatic.     Nose: No congestion or rhinorrhea.  Eyes:     General:        Right eye: No discharge.        Left eye: No discharge.     Extraocular Movements: Extraocular movements intact.     Pupils: Pupils  are equal, round, and reactive to light.  Cardiovascular:     Rate and Rhythm: Normal rate and regular rhythm.     Heart sounds: No murmur heard. Pulmonary:     Effort: No respiratory distress.     Breath sounds: No wheezing or rales.  Abdominal:     General: There is no distension.     Tenderness: There is abdominal tenderness in the right lower quadrant.  Musculoskeletal:        General: Normal range of motion.     Cervical back: Normal range of motion.  Skin:    General: Skin is warm and dry.  Neurological:     General: No focal deficit present.     Mental Status: He is alert.     ED Results and Treatments Labs (all labs ordered are listed, but only abnormal results are displayed) Labs Reviewed  URINALYSIS, ROUTINE W REFLEX MICROSCOPIC - Abnormal; Notable for the following components:      Result Value   Protein, ur 30 (*)    Bacteria, UA RARE (*)    All other components within normal limits  RESP PANEL BY RT-PCR (RSV, FLU A&B, COVID)  RVPGX2  GROUP A STREP BY PCR  LIPASE, BLOOD  COMPREHENSIVE METABOLIC PANEL  CBC                                                                                                                          Radiology US  SCROTUM W/DOPPLER  Result Date: 03/03/2022 CLINICAL DATA:  Evaluate for torsion.  Right testicular pain. EXAM: SCROTAL ULTRASOUND DOPPLER ULTRASOUND OF THE TESTICLES TECHNIQUE: Complete ultrasound examination of the testicles, epididymis, and other scrotal structures was performed. Color and spectral Doppler ultrasound were also utilized to evaluate blood flow to the testicles. COMPARISON:  CT abdomen and pelvis 03/03/2022 FINDINGS: Right testicle Measurements: 4.6 x 2.2 x 3.2 cm. No mass or microlithiasis visualized. Left testicle Measurements: 4.7 x 2.1 x 3.1 cm. No mass or microlithiasis visualized. Right epididymis:  Normal in size and appearance. Left epididymis: There is a simple cyst in the left epididymal head measuring 2.1 x 1.4 x 2.3 cm. Left epididymis is otherwise within normal limits. Hydrocele:  Small bilateral hydroceles are present. Varicocele:  None visualized. Pulsed Doppler interrogation of both testes demonstrates normal low resistance arterial and venous waveforms bilaterally. IMPRESSION: 1. No evidence of testicular torsion. 2. Simple cyst in the left epididymal head measuring up to 2.3 cm. 3. Small bilateral hydroceles. Electronically Signed   By: Darliss Cheney M.D.   On: 03/03/2022 19:39   CT ABDOMEN PELVIS W CONTRAST  Result Date: 03/03/2022 CLINICAL DATA:  Right lower quadrant abdominal pain for 1 month, nausea and constipation EXAM: CT ABDOMEN AND PELVIS WITH CONTRAST TECHNIQUE: Multidetector CT imaging of the abdomen and pelvis was performed using the standard protocol following bolus administration of intravenous contrast. RADIATION DOSE REDUCTION: This exam was performed according to the departmental dose-optimization program which includes automated exposure  control, adjustment of the mA and/or kV according to patient size and/or use of iterative reconstruction technique. CONTRAST:  80mL OMNIPAQUE IOHEXOL 300 MG/ML  SOLN COMPARISON:  None Available. FINDINGS: Lower chest: No  acute abnormality. Hepatobiliary: No solid liver abnormality is seen. No gallstones, gallbladder wall thickening, or biliary dilatation. Pancreas: Unremarkable. No pancreatic ductal dilatation or surrounding inflammatory changes. Spleen: Normal in size without significant abnormality. Adrenals/Urinary Tract: Adrenal glands are unremarkable. Kidneys are normal, without renal calculi, solid lesion, or hydronephrosis. Bladder is unremarkable. Stomach/Bowel: Stomach is within normal limits. Appendix is not clearly identified; candidate segment of appendix in the posterior right hemipelvis is normal (series 2, image 64). No evidence of bowel wall thickening, distention, or inflammatory changes. Moderate burden of stool throughout the colon. Vascular/Lymphatic: No significant vascular findings are present. No enlarged abdominal or pelvic lymph nodes. Reproductive: No mass or other significant abnormality. Other: No abdominal wall hernia or abnormality. No ascites. Musculoskeletal: No acute or significant osseous findings. IMPRESSION: 1. No acute CT findings of the abdomen or pelvis to explain right lower quadrant pain. 2. Appendix is not clearly identified; candidate segment of appendix in the posterior right hemipelvis is normal. No inflammatory findings in the right lower quadrant. 3. Moderate burden of stool throughout the colon. Electronically Signed   By: Jearld LeschAlex D Bibbey M.D.   On: 03/03/2022 17:56    Pertinent labs & imaging results that were available during my care of the patient were reviewed by me and considered in my medical decision making (see MDM for details).  Medications Ordered in ED Medications  acetaminophen (TYLENOL) tablet 650 mg (650 mg Oral Given 03/03/22 1503)  iohexol (OMNIPAQUE) 300 MG/ML solution 80 mL (80 mLs Intravenous Contrast Given 03/03/22 1727)                                                                                                                                      Procedures Procedures  (including critical care time)  Medical Decision Making / ED Course   This patient presents to the ED for concern of abdominal pain, fever, this involves an extensive number of treatment options, and is a complaint that carries with it a high risk of complications and morbidity.  The differential diagnosis includes appendicitis, viral gastroenteritis, influenza, strep throat, epididymitis, testicular mass, torsion constipation  MDM: Patient seen emerged part for evaluation of abdominal pain, nausea, fever, testicular pain.  Physical exam with very mild right lower quadrant tenderness to palpation and no appreciable mass, tenderness or swelling to the testicles.  Cremaster reflex intact.  Laboratory evaluation with no leukocytosis and is otherwise unremarkable.  COVID, flu, RSV, strep negative.  Urinalysis unremarkable.  CT abdomen pelvis unremarkable with moderate stool burden throughout the colon and I suspect the patient's diarrhea may be liquid stool around solid stool that cannot move very well.  He will ultimately leave with laxative therapy for bowel cleanout.  Testicular  ultrasound with a simple cyst in the left epididymal head measuring 2.3 cm with small bilateral hydroceles but no evidence of torsion, abscess or mass.  On reevaluation, symptoms have improved and he currently does not meet inpatient criteria for admission.  Ambulatory GI referral placed.  He will be discharged on Bentyl and MiraLAX for bowel cleanout and was given strict return precautions to which he voiced understanding.  Patient be discharged.   Additional history obtained: -Additional history obtained from parents -External records from outside source obtained and reviewed including: Chart review including previous notes, labs, imaging, consultation notes   Lab Tests: -I ordered, reviewed, and interpreted labs.   The pertinent results include:   Labs Reviewed  URINALYSIS, ROUTINE W REFLEX  MICROSCOPIC - Abnormal; Notable for the following components:      Result Value   Protein, ur 30 (*)    Bacteria, UA RARE (*)    All other components within normal limits  RESP PANEL BY RT-PCR (RSV, FLU A&B, COVID)  RVPGX2  GROUP A STREP BY PCR  LIPASE, BLOOD  COMPREHENSIVE METABOLIC PANEL  CBC       Imaging Studies ordered: I ordered imaging studies including CTAP, testicular ultrasound I independently visualized and interpreted imaging. I agree with the radiologist interpretation   Medicines ordered and prescription drug management: Meds ordered this encounter  Medications   acetaminophen (TYLENOL) tablet 650 mg   iohexol (OMNIPAQUE) 300 MG/ML solution 80 mL   polyethylene glycol (MIRALAX) 17 g packet    Sig: Take 17 g by mouth daily.    Dispense:  14 each    Refill:  0   dicyclomine (BENTYL) 20 MG tablet    Sig: Take 1 tablet (20 mg total) by mouth 2 (two) times daily.    Dispense:  20 tablet    Refill:  0    -I have reviewed the patients home medicines and have made adjustments as needed  Critical interventions none    Cardiac Monitoring: The patient was maintained on a cardiac monitor.  I personally viewed and interpreted the cardiac monitored which showed an underlying rhythm of: NSR  Social Determinants of Health:  Factors impacting patients care include: none   Reevaluation: After the interventions noted above, I reevaluated the patient and found that they have :improved  Co morbidities that complicate the patient evaluation History reviewed. No pertinent past medical history.    Dispostion: I considered admission for this patient, but he does not meet inpatient criteria for admission he is safe for discharge outpatient follow-up     Final Clinical Impression(s) / ED Diagnoses Final diagnoses:  Right lower quadrant abdominal pain     @PCDICTATION @    Effa Yarrow, , MD 03/04/22 1242

## 2022-03-03 NOTE — ED Triage Notes (Signed)
Pt with RLQ pain for a month, getting worse.  Pt with diarrhea, + nausea, denies emesis. Fever this morning 102. + sore throat off and on. + HA with light sensitivity

## 2022-03-03 NOTE — ED Notes (Addendum)
Patient to US at this time.

## 2022-03-04 ENCOUNTER — Encounter: Payer: Self-pay | Admitting: Internal Medicine

## 2022-03-14 DIAGNOSIS — Z113 Encounter for screening for infections with a predominantly sexual mode of transmission: Secondary | ICD-10-CM | POA: Diagnosis not present

## 2022-03-14 DIAGNOSIS — K5900 Constipation, unspecified: Secondary | ICD-10-CM | POA: Diagnosis not present

## 2022-03-14 DIAGNOSIS — N50819 Testicular pain, unspecified: Secondary | ICD-10-CM | POA: Diagnosis not present

## 2022-03-14 DIAGNOSIS — J019 Acute sinusitis, unspecified: Secondary | ICD-10-CM | POA: Diagnosis not present

## 2022-04-13 NOTE — Progress Notes (Deleted)
GI Office Note    Referring Provider: Celene Squibb, MD Primary Care Physician:  Celene Squibb, MD  Primary Gastroenterologist: ***  Chief Complaint   No chief complaint on file.    History of Present Illness   Evan Olsen is a 27 y.o. male presenting today at the request of Celene Squibb, MD for ***ED follow up of RLQ abdominal pain.   ED visit 03/03/22. ***  CT A/P 03/03/22: -***  Scrotal US 03/03/22: -***  Today:      Current Outpatient Medications  Medication Sig Dispense Refill   dicyclomine (BENTYL) 20 MG tablet Take 1 tablet (20 mg total) by mouth 2 (two) times daily. 20 tablet 0   polyethylene glycol (MIRALAX) 17 g packet Take 17 g by mouth daily. 14 each 0   No current facility-administered medications for this visit.    No past medical history on file.  Past Surgical History:  Procedure Laterality Date   ANTERIOR CRUCIATE LIGAMENT REPAIR Left     No family history on file.  Allergies as of 04/14/2022   (No Known Allergies)    Social History   Socioeconomic History   Marital status: Single    Spouse name: Not on file   Number of children: Not on file   Years of education: Not on file   Highest education level: Not on file  Occupational History   Not on file  Tobacco Use   Smoking status: Never   Smokeless tobacco: Never  Substance and Sexual Activity   Alcohol use: Yes    Comment: occ use   Drug use: Never   Sexual activity: Not on file  Other Topics Concern   Not on file  Social History Narrative   Not on file   Social Determinants of Health   Financial Resource Strain: Not on file  Food Insecurity: Not on file  Transportation Needs: Not on file  Physical Activity: Not on file  Stress: Not on file  Social Connections: Not on file  Intimate Partner Violence: Not on file     Review of Systems   Gen: Denies any fever, chills, fatigue, weight loss, lack of appetite.  CV: Denies chest pain, heart palpitations,  peripheral edema, syncope.  Resp: Denies shortness of breath at rest or with exertion. Denies wheezing or cough.  GI: see HPI GU : Denies urinary burning, urinary frequency, urinary hesitancy MS: Denies joint pain, muscle weakness, cramps, or limitation of movement.  Derm: Denies rash, itching, dry skin Psych: Denies depression, anxiety, memory loss, and confusion Heme: Denies bruising, bleeding, and enlarged lymph nodes.   Physical Exam   There were no vitals taken for this visit.  General:   Alert and oriented. Pleasant and cooperative. Well-nourished and well-developed.  Head:  Normocephalic and atraumatic. Eyes:  Without icterus, sclera clear and conjunctiva pink.  Ears:  Normal auditory acuity. Mouth:  No deformity or lesions, oral mucosa pink.  Lungs:  Clear to auscultation bilaterally. No wheezes, rales, or rhonchi. No distress.  Heart:  S1, S2 present without murmurs appreciated.  Abdomen:  +BS, soft, non-tender and non-distended. No HSM noted. No guarding or rebound. No masses appreciated.  Rectal:  Deferred  Msk:  Symmetrical without gross deformities. Normal posture. Extremities:  Without edema. Neurologic:  Alert and  oriented x4;  grossly normal neurologically. Skin:  Intact without significant lesions or rashes. Psych:  Alert and cooperative. Normal mood and affect.   Assessment   Evan Olsen  is a 27 y.o. male with a history of *** presenting today with      PLAN   ***    Venetia Night, MSN, FNP-BC, AGACNP-BC [facility]

## 2022-04-14 ENCOUNTER — Ambulatory Visit: Payer: BC Managed Care – PPO | Admitting: Gastroenterology

## 2022-05-14 ENCOUNTER — Encounter (HOSPITAL_COMMUNITY): Payer: Self-pay | Admitting: Emergency Medicine

## 2022-05-14 ENCOUNTER — Other Ambulatory Visit: Payer: Self-pay

## 2022-05-14 ENCOUNTER — Emergency Department (HOSPITAL_COMMUNITY)
Admission: EM | Admit: 2022-05-14 | Discharge: 2022-05-14 | Disposition: A | Discharge status: Home / Self Care | Payer: Medicaid Other | Attending: Emergency Medicine | Admitting: Emergency Medicine

## 2022-05-14 DIAGNOSIS — R509 Fever, unspecified: Secondary | ICD-10-CM | POA: Diagnosis present

## 2022-05-14 DIAGNOSIS — R000 Tachycardia, unspecified: Secondary | ICD-10-CM | POA: Diagnosis not present

## 2022-05-14 DIAGNOSIS — Z20822 Contact with and (suspected) exposure to covid-19: Secondary | ICD-10-CM | POA: Insufficient documentation

## 2022-05-14 DIAGNOSIS — J101 Influenza due to other identified influenza virus with other respiratory manifestations: Secondary | ICD-10-CM | POA: Insufficient documentation

## 2022-05-14 LAB — RESP PANEL BY RT-PCR (RSV, FLU A&B, COVID)  RVPGX2
Influenza A by PCR: NEGATIVE
Influenza B by PCR: POSITIVE — AB
Resp Syncytial Virus by PCR: NEGATIVE
SARS Coronavirus 2 by RT PCR: NEGATIVE

## 2022-05-14 MED ORDER — ONDANSETRON 4 MG PO TBDP: 4.0000 mg | ORAL_TABLET | Freq: Three times a day (TID) | ORAL | 0 refills | Status: DC | PRN

## 2022-05-14 MED ORDER — ONDANSETRON 4 MG PO TBDP
4.0000 mg | ORAL_TABLET | Freq: Once | ORAL | Status: AC
  Administered 2022-05-14: 4 mg via ORAL
  Filled 2022-05-14: qty 1

## 2022-05-14 MED ORDER — ACETAMINOPHEN 325 MG PO TABS
650.0000 mg | ORAL_TABLET | Freq: Once | ORAL | Status: AC
  Administered 2022-05-14: 650 mg via ORAL
  Filled 2022-05-14: qty 2

## 2022-05-14 NOTE — ED Triage Notes (Signed)
Pt c/o chills, body aches, fever, nausea, and sinus congestion since yesterday.

## 2022-05-14 NOTE — ED Provider Notes (Signed)
[place]  Provider Note  CSN: [id number] Arrival date & time: 05/14/22 0340  History Chief Complaint  Patient presents with   Flu Symptoms    Evan Olsen is a 27 y.o. male with no known medical problems reports he had a febrile illness about a week ago. He saw his PCP 4 days ago where Covid swab was neg. He felt better over the weekend but began feeling sick again yesterday with fever, cough, body aches and nausea. One episode of emesis after arrival. No known sick contacts.    Home Medications Prior to Admission medications   Medication Sig Start Date End Date Taking? Authorizing Provider  ondansetron (ZOFRAN-ODT) 4 MG disintegrating tablet Take 1 tablet (4 mg total) by mouth every 8 (eight) hours as needed for nausea or vomiting. 05/14/22  Yes Truddie Hidden, MD     Allergies    Patient has no known allergies.   Review of Systems   Review of Systems Please see HPI for pertinent positives and negatives  Physical Exam BP 129/67 (BP Location: Right Arm)   Pulse (!) 116   Temp 99.9 F (37.7 C) (Oral)   Resp 17   Ht 5' 9"$  (1.753 m)   Wt 59.9 kg   SpO2 100%   BMI 19.50 kg/m   Physical Exam Vitals and nursing note reviewed.  Constitutional:      Appearance: Normal appearance.  HENT:     Head: Normocephalic and atraumatic.     Nose: Nose normal.     Mouth/Throat:     Mouth: Mucous membranes are moist.  Eyes:     Extraocular Movements: Extraocular movements intact.     Conjunctiva/sclera: Conjunctivae normal.  Cardiovascular:     Rate and Rhythm: Normal rate.  Pulmonary:     Effort: Pulmonary effort is normal.     Breath sounds: Normal breath sounds.  Abdominal:     General: Abdomen is flat.     Palpations: Abdomen is soft.     Tenderness: There is no abdominal tenderness.  Musculoskeletal:        General: No swelling. Normal range of motion.     Cervical back: Neck supple.  Skin:    General: Skin is  warm and dry.  Neurological:     General: No focal deficit present.     Mental Status: He is alert.  Psychiatric:        Mood and Affect: Mood normal.     ED Results / Procedures / Treatments   EKG EKG Interpretation  Date/Time:  Wednesday May 14 2022 04:21:44 EST Ventricular Rate:  112 PR Interval:  125 QRS Duration: 107 QT Interval:  313 QTC Calculation: 428 R Axis:   127 Text Interpretation: Sinus tachycardia Right axis deviation Baseline wander in lead(s) V2 Since last tracing Rate faster Confirmed by Calvert Cantor [phone]) on 05/14/2022 4:31:49 AM  Procedures Procedures  Medications Ordered in the ED Medications  ondansetron (ZOFRAN-ODT) disintegrating tablet 4 mg (4 mg Oral Given 05/14/22 0602)  acetaminophen (TYLENOL) tablet 650 mg (650 mg Oral Given 05/14/22 0602)    Initial Impression and Plan  Patient here with viral syndrome, vitals show mild tachycardia. Will give Zofran/APAP for symptom control. Covid/Flu/RSV swab is positive for Flu B.   ED Course   Clinical Course as of 05/14/22 0630  Wed May 14, 2022  [id number] Patient feeling better after zofran, tolerating PO. States his anxiety is 'through the roof', but that  he has Wellbutrin and Trazodone he can take at home. Do not feel he would benefit from Tamiflu given duration of his symptoms. Rx for Zofran to help with nausea. Encouraged to drink plenty of fluids, rest and PCP follow up, RTED for any other concerns.   [CS]    Clinical Course User Index [CS] Truddie Hidden, MD     MDM Rules/Calculators/A&P Medical Decision Making Problems Addressed: Influenza B: acute illness or injury  Amount and/or Complexity of Data Reviewed Labs: ordered. Decision-making details documented in ED Course.  Risk OTC drugs. Prescription drug management.     Final Clinical Impression(s) / ED Diagnoses Final diagnoses:  Influenza B    Rx / DC Orders ED Discharge Orders          Ordered    ondansetron  (ZOFRAN-ODT) 4 MG disintegrating tablet  Every 8 hours PRN        05/14/22 0629             Truddie Hidden, MD 05/14/22 0630

## 2023-03-09 ENCOUNTER — Telehealth: Payer: Self-pay | Admitting: Neurology

## 2023-03-09 NOTE — Telephone Encounter (Signed)
Pt calling to confirm next appt.

## 2023-03-19 ENCOUNTER — Encounter: Payer: Self-pay | Admitting: *Deleted

## 2023-03-19 DIAGNOSIS — J029 Acute pharyngitis, unspecified: Secondary | ICD-10-CM | POA: Insufficient documentation

## 2023-03-19 DIAGNOSIS — R040 Epistaxis: Secondary | ICD-10-CM | POA: Insufficient documentation

## 2023-03-19 DIAGNOSIS — R519 Headache, unspecified: Secondary | ICD-10-CM | POA: Insufficient documentation

## 2023-03-19 DIAGNOSIS — F419 Anxiety disorder, unspecified: Secondary | ICD-10-CM | POA: Insufficient documentation

## 2023-03-23 ENCOUNTER — Encounter: Payer: Self-pay | Admitting: Neurology

## 2023-03-23 ENCOUNTER — Ambulatory Visit (INDEPENDENT_AMBULATORY_CARE_PROVIDER_SITE_OTHER): Payer: BC Managed Care – PPO | Admitting: Neurology

## 2023-03-23 VITALS — BP 105/61 | HR 76 | Ht 69.0 in | Wt 130.0 lb

## 2023-03-23 DIAGNOSIS — M26609 Unspecified temporomandibular joint disorder, unspecified side: Secondary | ICD-10-CM | POA: Diagnosis not present

## 2023-03-23 DIAGNOSIS — G4489 Other headache syndrome: Secondary | ICD-10-CM

## 2023-03-23 DIAGNOSIS — G43009 Migraine without aura, not intractable, without status migrainosus: Secondary | ICD-10-CM | POA: Diagnosis not present

## 2023-03-23 DIAGNOSIS — R519 Headache, unspecified: Secondary | ICD-10-CM

## 2023-03-23 DIAGNOSIS — G4486 Cervicogenic headache: Secondary | ICD-10-CM

## 2023-03-23 NOTE — Patient Instructions (Signed)
It was nice to meet you today.  Please remember, common headache triggers are: sleep deprivation, dehydration, overheating, stress, hypoglycemia or skipping meals and blood sugar fluctuations, excessive pain medications or excessive alcohol use or caffeine withdrawal. Some people have food triggers such as aged cheese, orange juice or chocolate, especially dark chocolate, or MSG (monosodium glutamate). Try to avoid these headache triggers as much possible. It may be helpful to keep a headache diary to figure out what makes your headaches worse or brings them on and what alleviates them. Some people report headache onset after exercise but studies have shown that regular exercise may actually prevent headaches from coming. If you have exercise-induced headaches, please make sure that you drink plenty of fluid before and after exercising and that you do not over do it and do not overheat.  We may consider medication daily for migraine prevention at some point. For now, I encourage you to try the Imitrex as prescribed by your primary care provider.   This is a tried and tested medication for acute migraines.    Your exam looks good, but we will do a brain MRI without contrast (as per your preference) to rule any structural cause for recurrent headaches. If you don't hear back from our office by the next 2 weeks, call us back or email [place] through MyChart for an update.   So long as your MRI shows benign findings, we can follow you in this clinic as needed.

## 2023-03-23 NOTE — Progress Notes (Signed)
Subjective:    Patient ID: Evan Olsen is a 27 y.o. male.  HPI    Evan Foley, MD, PhD [facility] [address], [address] [address] [place], [place] [address]  Dear [name],  I saw your patient, Evan Olsen upon your kind request in my neurologic clinic today for evaluation of his recurrent headaches.  The patient is unaccompanied today.  As you know, [name] is a 27 year old male with an underlying medical history of allergies, anxiety (currently not on any medications), and ADHD (as a child, no longer on stimulant therapy), who reports recurrent headaches for the past approximately 5 years.  He has different types of headaches including temporal headaches which radiate backwards.  Neck related pain, upper shoulder pain.  Once a month he has a more severe headache including throbbing pain, associated with nausea, light sensitivity.  He has a prescription for Imitrex but has not tried it for fear of side effects.  He has been taking riboflavin.  He usually takes 300 mg daily.  He hydrates well with water, estimates that he drinks at least 1.5 L of water per day.  He does not drink any caffeine daily.  He does not drink any alcohol.  He does not smoke cigarettes or marijuana.  He works as a Runner, broadcasting/film/video.  He lives alone.  He reports a family history of migraines, unsure if his mom had them, she died young at the age of 67 from ovarian cancer.  He has several maternal aunts with migraine headaches and maternal grandmother also had headaches.  He denies any sudden onset of one-sided weakness or numbness or tingling or droopy face or slurring of speech.  He has occasional blurry vision, rare floaters.  His eye exam is up-to-date, within the past 6 months but does not recall having a dilated full eye exam.  He goes to Dr. Charise Killian at [facility] in [place].  He wears contact lenses.  Prescription has not changed much last time. 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 He reports stress and anxiety as triggers for his headaches.  He is currently no longer on anxiety medication, last medicine tried was Wellbutrin.  He felt that it made his anxiety worse.  He had a sleep study several years ago in 2018 through [facility].  He was supposed to have an MSLT but did not stay for it.  PSG did not show any significant obstructive sleep apnea.  I reviewed [facility] sleep records.  He was on Seroquel in the past.  He was on Vyvanse in the past. I reviewed the office and 25 2024. He had blood work through your office on 10/20/2020: Reviewed the results in his paper records.  Testosterone level was normal range at 726.  RPR nonreactive, HIV screen negative A1c was 5.5, hepatitis B and C antibodies negative, lipid panel benign with total cholesterol of 173, triglycerides 45, LDL 150 with total glucose 77, BUN 16, creatinine 1.1, liver enzymes.  CBC with differential unremarkable.  He has  been taking riboflavin.  He has been treated with dry needling for his headaches and neck pain which has been helpful.  His Past Medical History Is Significant For: Past Medical History:  Diagnosis Date   Migraine     His Past Surgical History Is Significant For: Past Surgical History:  Procedure Laterality Date   ANTERIOR CRUCIATE LIGAMENT REPAIR Left  His Family History Is Significant For: History reviewed. No pertinent family history.  His Social History Is Significant For: Social History   Socioeconomic History   Marital status: Single    Spouse name: Not on file   Number of children: Not on file   Years of education: Not on file   Highest education level: Not on file  Occupational History   Not on file  Tobacco Use   Smoking status: Never   Smokeless tobacco: Never  Substance and Sexual Activity   Alcohol use: Yes    Comment: occ use   Drug use: Never   Sexual activity: Not on file  Other Topics Concern   Not on  file  Social History Narrative   Not on file   Social Drivers of Health   Financial Resource Strain: Not on file  Food Insecurity: Not on file  Transportation Needs: Not on file  Physical Activity: Not on file  Stress: Not on file  Social Connections: Unknown (03/03/2022)   [facility] from [facility], [facility]   Social Network    Social Network: Not on file    His Allergies Are:  No Known Allergies:   His Current Medications Are:  Outpatient Encounter Medications as of 03/23/2023  Medication Sig   Bacillus Coagulans-Inulin (PROBIOTIC) 1-250 BILLION-MG CAPS    L-Lysine HCl 1000 MG TABS    levocetirizine (XYZAL) 5 MG tablet Take 5 mg by mouth every evening.   Magnesium (MAGNESIUM COMPLEX HIGH POTENCY) 250 MG CAPS    Omega-3 Fatty Acids (FISH OIL) 1000 MG CAPS    Psyllium 0.36 g CAPS    Riboflavin (B-2) 100 MG TABS    [DISCONTINUED] ondansetron (ZOFRAN-ODT) 4 MG disintegrating tablet Take 1 tablet (4 mg total) by mouth every 8 (eight) hours as needed for nausea or vomiting.   No facility-administered encounter medications on file as of 03/23/2023.  :   Review of Systems:  Out of a complete 14 point review of systems, all are reviewed and negative with the exception of these symptoms as listed below:   Review of Systems  Neurological:        Rm 9 alone Pt is well, reports he has been having migraines for about 5 years. He has a lot neck and shoulder pain with stiffness. He has a headache daily. Associated blurred vision, floaters, nausea, more anxious.     Objective:  Neurological Exam  Physical Exam Physical Examination:   Vitals:   03/23/23 0758  BP: 105/61  Pulse: 76    General Examination: The patient is a very pleasant 27 y.o. male in no acute distress. He appears well-developed and well-nourished and well groomed.   HEENT: Normocephalic, atraumatic, pupils are equal, round and reactive to light, no photophobia.  Funduscopic exam benign.  Pupils on  the larger side but equal and reactive.  Extraocular tracking is good without limitation to gaze excursion or nystagmus noted. Hearing is grossly intact. Face is symmetric with normal facial animation and normal facial sensation to light touch, temperature and vibration sense. Speech is clear with no dysarthria noted. There is no hypophonia. There is no lip, neck/head, jaw or voice tremor. Neck is supple with full range of passive and active motion. There are no carotid bruits on auscultation. Oropharynx exam reveals: mild mouth dryness, good dental hygiene.  Tongue protrudes centrally and palate elevates symmetrically.   Chest: Clear to auscultation without wheezing, rhonchi or crackles noted.  Heart: S1+S2+0, regular and normal without murmurs, rubs  or gallops noted.   Abdomen: Soft, non-tender and non-distended.  Extremities: There is no pitting edema in the distal lower extremities bilaterally.   Skin: Warm and dry without trophic changes noted.   Musculoskeletal: exam reveals no obvious joint deformities.   Neurologically:  Mental status: The patient is awake, alert and oriented in all 4 spheres. His immediate and remote memory, attention, language skills and fund of knowledge are appropriate. There is no evidence of aphasia, agnosia, apraxia or anomia. Speech is clear with normal prosody and enunciation. Thought process is linear. Mood is normal and affect is normal.  Cranial nerves II - XII are as described above under HEENT exam.  Motor exam: Normal bulk, strength and tone is noted. There is no obvious action or resting tremor.  No drift or rebound.  No intention tremor or postural tremor. Fine motor skills and coordination: intact finger taps and foot taps.  Cerebellar testing: No dysmetria or intention tremor. There is no truncal or gait ataxia.  Normal finger-to-nose, normal heel-to-shin bilaterally. Reflexes 2+ throughout, toes are downgoing bilaterally.  Romberg negative.  Sensory  exam: intact to light touch, temperature and vibration sense in the upper and lower extremities.  Gait, station and balance: He stands easily. No veering to one side is noted. No leaning to one side is noted. Posture is age-appropriate and stance is narrow based. Gait shows normal stride length and normal pace. No problems turning are noted.  Normal tandem walk.  Assessment and Plan:  In summary, Keeghan G Zingsheim is a very pleasant 27 y.o.-year old male with an underlying medical history of allergies, anxiety (currently not on any medications), and ADHD (as a child, no longer on stimulant therapy), who presents for evaluation of his recurrent headaches of several years duration.  Headaches over migrainous, also likely a combination of stress and tension related headaches, cervicogenic headaches and TMJ related headaches possible.  Neurological exam is nonfocal.  He has been taking over-the-counter supplements, including riboflavin.  He is encouraged to continue with riboflavin about 200 mg daily and could add magnesium, about 250 mg daily.  He is encouraged to drink about 2 L of water per day.  He is largely reassured today.  He is encouraged to trial the Imitrex as prescribed by your office.  We talked about headache triggers and alleviating factors.  He is encouraged to think about counseling or therapy for his anxiety.  He is willing to consider it.  Dry needling helped with his neck and shoulder pain but he is encouraged to talk to you about seeing a spine specialist if need be.  He is advised to get his eye exam updated with a full, dilated eye exam.  We will proceed with a brain MRI to rule out a structural cause of his headaches.  He would like to do the MRI without contrast due to the IV involved, he has a history of vasovagal response to IV placement.  He prefers the MRI without contrast which we will pursue at this time.  We will call him with the MRI results.  So long as the MRI is benign and he has  had no alarming change in his history and presentation, we can see him in this clinic as needed.  I answered all his questions today and he was in agreement with our approach.   This was an extended visit of over 60 minutes due to several headache etiologies and problems addressed, and extended chart review involved, considerable counseling and  coordination of care, particularly explaining the benefits of MRI with contrast versus without.   Thank you very much for allowing me to participate in the care of this nice patient. If I can be of any further assistance to you please do not hesitate to call me at [phone].  Sincerely,   Evan Foley, MD, PhD

## 2023-04-02 ENCOUNTER — Telehealth: Payer: Self-pay | Admitting: Neurology

## 2023-04-02 NOTE — Telephone Encounter (Signed)
 Both of his insurances popped out of his chart because they're inactive.

## 2023-04-15 ENCOUNTER — Encounter: Payer: Self-pay | Admitting: Neurology

## 2023-04-22 ENCOUNTER — Encounter: Payer: Self-pay | Admitting: Neurology

## 2023-04-22 ENCOUNTER — Ambulatory Visit
Admission: RE | Admit: 2023-04-22 | Discharge: 2023-04-22 | Disposition: A | Discharge status: Home / Self Care | Payer: Self-pay | Source: Ambulatory Visit | Attending: Neurology | Admitting: Neurology

## 2023-04-22 DIAGNOSIS — R519 Headache, unspecified: Secondary | ICD-10-CM | POA: Diagnosis not present

## 2023-04-22 DIAGNOSIS — G4489 Other headache syndrome: Secondary | ICD-10-CM

## 2023-04-22 DIAGNOSIS — M26609 Unspecified temporomandibular joint disorder, unspecified side: Secondary | ICD-10-CM

## 2023-04-22 DIAGNOSIS — G43009 Migraine without aura, not intractable, without status migrainosus: Secondary | ICD-10-CM

## 2023-04-22 DIAGNOSIS — G4486 Cervicogenic headache: Secondary | ICD-10-CM
